# Patient Record
Sex: Female | Born: 1965 | Race: White | Hispanic: No | Marital: Married
Health system: Southern US, Community
[De-identification: ages and names within clinical notes are randomized; demographics above are authoritative.]

---

## 2013-07-01 ENCOUNTER — Other Ambulatory Visit: Payer: Self-pay | Admitting: Obstetrics and Gynecology

## 2013-07-01 DIAGNOSIS — Z1231 Encounter for screening mammogram for malignant neoplasm of breast: Secondary | ICD-10-CM

## 2013-07-28 ENCOUNTER — Ambulatory Visit
Admission: RE | Admit: 2013-07-28 | Discharge: 2013-07-28 | Disposition: A | Discharge status: Home / Self Care | Payer: Self-pay | Source: Ambulatory Visit | Attending: Obstetrics and Gynecology | Admitting: Obstetrics and Gynecology

## 2013-07-28 ENCOUNTER — Encounter (INDEPENDENT_AMBULATORY_CARE_PROVIDER_SITE_OTHER): Payer: Self-pay

## 2013-07-28 DIAGNOSIS — Z1231 Encounter for screening mammogram for malignant neoplasm of breast: Secondary | ICD-10-CM

## 2013-08-06 ENCOUNTER — Other Ambulatory Visit: Payer: Self-pay | Admitting: Obstetrics and Gynecology

## 2013-08-06 DIAGNOSIS — R928 Other abnormal and inconclusive findings on diagnostic imaging of breast: Secondary | ICD-10-CM

## 2013-08-18 ENCOUNTER — Ambulatory Visit
Admission: RE | Admit: 2013-08-18 | Discharge: 2013-08-18 | Disposition: A | Discharge status: Home / Self Care | Payer: BC Managed Care – PPO | Source: Ambulatory Visit | Attending: Obstetrics and Gynecology | Admitting: Obstetrics and Gynecology

## 2013-08-18 DIAGNOSIS — R928 Other abnormal and inconclusive findings on diagnostic imaging of breast: Secondary | ICD-10-CM

## 2014-05-10 ENCOUNTER — Other Ambulatory Visit: Payer: Self-pay | Admitting: Obstetrics and Gynecology

## 2014-05-10 DIAGNOSIS — Z1231 Encounter for screening mammogram for malignant neoplasm of breast: Secondary | ICD-10-CM

## 2014-08-05 ENCOUNTER — Ambulatory Visit: Payer: Self-pay

## 2014-08-26 ENCOUNTER — Ambulatory Visit
Admission: RE | Admit: 2014-08-26 | Discharge: 2014-08-26 | Disposition: A | Discharge status: Home / Self Care | Payer: BLUE CROSS/BLUE SHIELD | Source: Ambulatory Visit | Attending: Obstetrics and Gynecology | Admitting: Obstetrics and Gynecology

## 2014-08-26 DIAGNOSIS — Z1231 Encounter for screening mammogram for malignant neoplasm of breast: Secondary | ICD-10-CM

## 2015-03-04 ENCOUNTER — Encounter (HOSPITAL_BASED_OUTPATIENT_CLINIC_OR_DEPARTMENT_OTHER): Payer: Self-pay

## 2015-03-04 ENCOUNTER — Ambulatory Visit (HOSPITAL_BASED_OUTPATIENT_CLINIC_OR_DEPARTMENT_OTHER): Admit: 2015-03-04 | Payer: BLUE CROSS/BLUE SHIELD | Admitting: Obstetrics & Gynecology

## 2015-03-04 SURGERY — DILATATION & CURETTAGE/HYSTEROSCOPY WITH MYOSURE
Anesthesia: General

## 2016-05-03 ENCOUNTER — Other Ambulatory Visit: Payer: Self-pay | Admitting: Obstetrics & Gynecology

## 2016-05-03 DIAGNOSIS — R1905 Periumbilic swelling, mass or lump: Secondary | ICD-10-CM

## 2016-05-03 DIAGNOSIS — R198 Other specified symptoms and signs involving the digestive system and abdomen: Secondary | ICD-10-CM

## 2016-05-08 ENCOUNTER — Ambulatory Visit
Admission: RE | Admit: 2016-05-08 | Discharge: 2016-05-08 | Disposition: A | Discharge status: Home / Self Care | Payer: BLUE CROSS/BLUE SHIELD | Source: Ambulatory Visit | Attending: Obstetrics & Gynecology | Admitting: Obstetrics & Gynecology

## 2016-05-08 DIAGNOSIS — R1905 Periumbilic swelling, mass or lump: Secondary | ICD-10-CM

## 2016-05-08 DIAGNOSIS — R198 Other specified symptoms and signs involving the digestive system and abdomen: Secondary | ICD-10-CM

## 2016-05-08 MED ORDER — IOPAMIDOL (ISOVUE-300) INJECTION 61%
100.0000 mL | Freq: Once | INTRAVENOUS | Status: AC | PRN
  Administered 2016-05-08: 100 mL via INTRAVENOUS

## 2019-06-11 ENCOUNTER — Other Ambulatory Visit: Payer: Self-pay | Admitting: Obstetrics & Gynecology

## 2019-06-11 DIAGNOSIS — R928 Other abnormal and inconclusive findings on diagnostic imaging of breast: Secondary | ICD-10-CM

## 2019-06-25 ENCOUNTER — Ambulatory Visit
Admission: RE | Admit: 2019-06-25 | Discharge: 2019-06-25 | Disposition: A | Discharge status: Home / Self Care | Payer: BLUE CROSS/BLUE SHIELD | Source: Ambulatory Visit | Attending: Obstetrics & Gynecology | Admitting: Obstetrics & Gynecology

## 2019-06-25 ENCOUNTER — Ambulatory Visit
Admission: RE | Admit: 2019-06-25 | Discharge: 2019-06-25 | Disposition: A | Discharge status: Home / Self Care | Payer: Commercial Managed Care - PPO | Source: Ambulatory Visit | Attending: Obstetrics & Gynecology | Admitting: Obstetrics & Gynecology

## 2019-06-25 ENCOUNTER — Other Ambulatory Visit: Payer: Self-pay

## 2019-06-25 DIAGNOSIS — R928 Other abnormal and inconclusive findings on diagnostic imaging of breast: Secondary | ICD-10-CM

## 2021-04-15 IMAGING — MG MM DIGITAL DIAGNOSTIC UNILAT*R* W/ TOMO W/ CAD
8 series · 9 of 24 positions shown · non-contrast
Comparison: Previous exam(s).

CLINICAL DATA: Patient recalled from screening for possible right
breast mass.

EXAM:
DIGITAL DIAGNOSTIC RIGHT MAMMOGRAM WITH CAD AND TOMO
ULTRASOUND RIGHT BREAST

[R ML synth-2D]
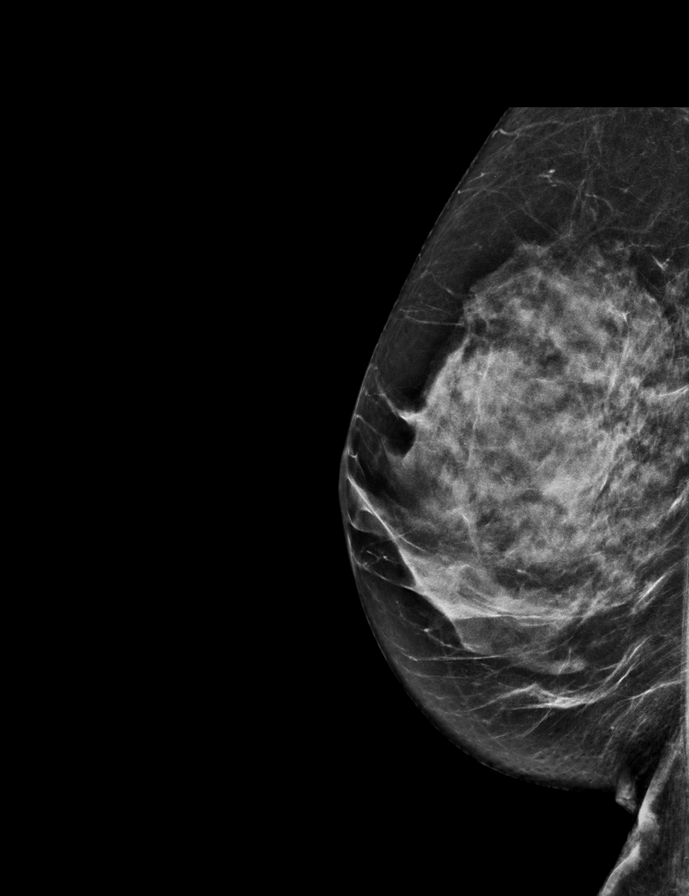

[R CC synth-2D]
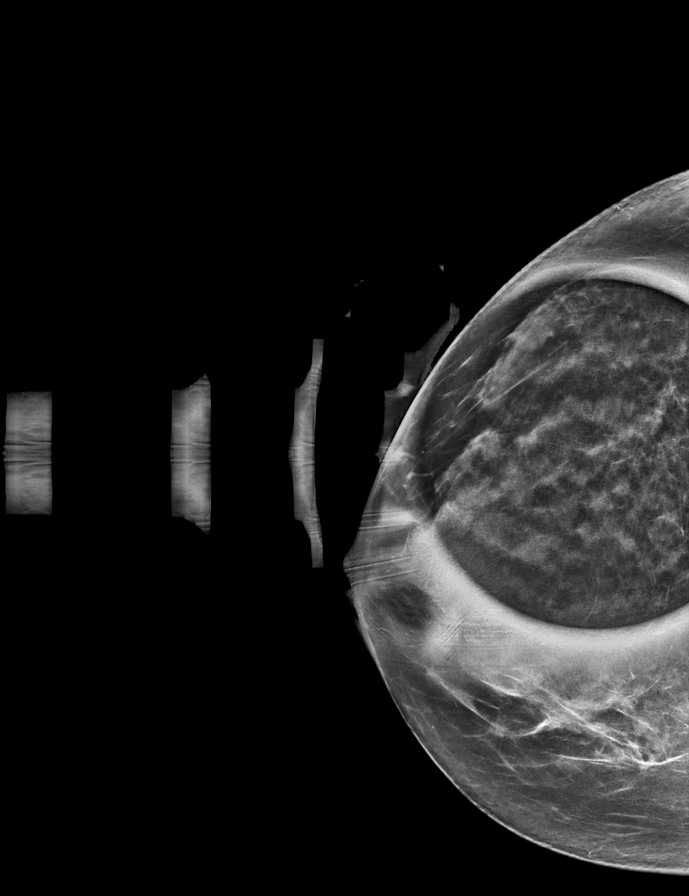

[R MLO synth-2D (1 of 2)]
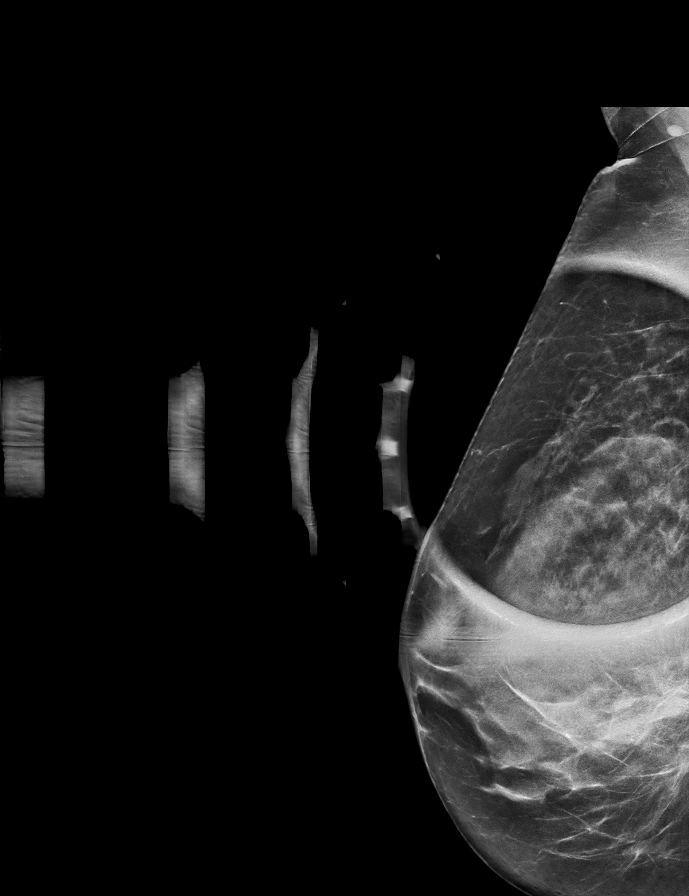

[R MLO synth-2D (2 of 2)]
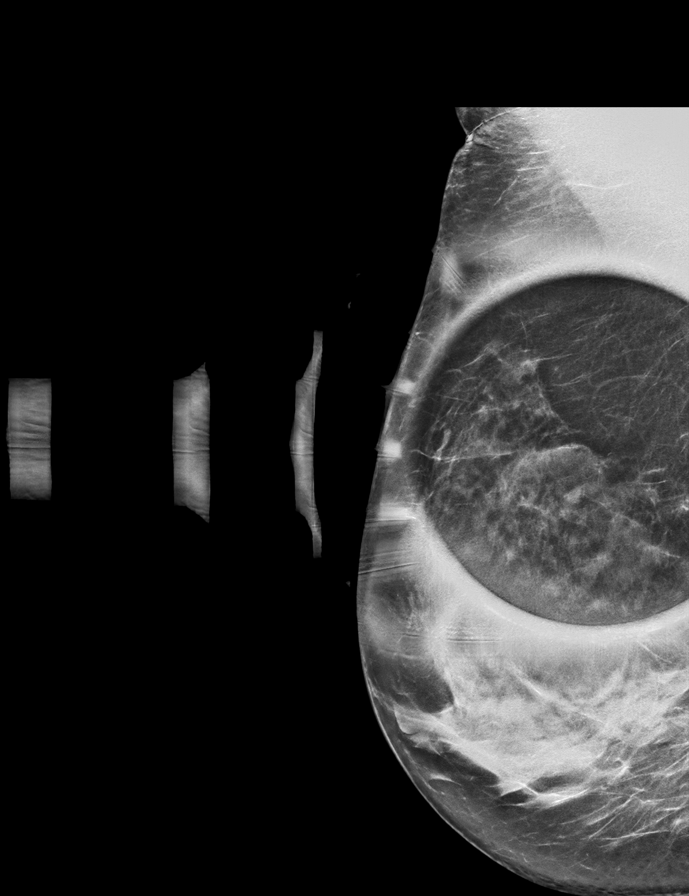

[R MLO tomo · 2 of 62 frames shown (1 of 2)]
[frame 21/62]
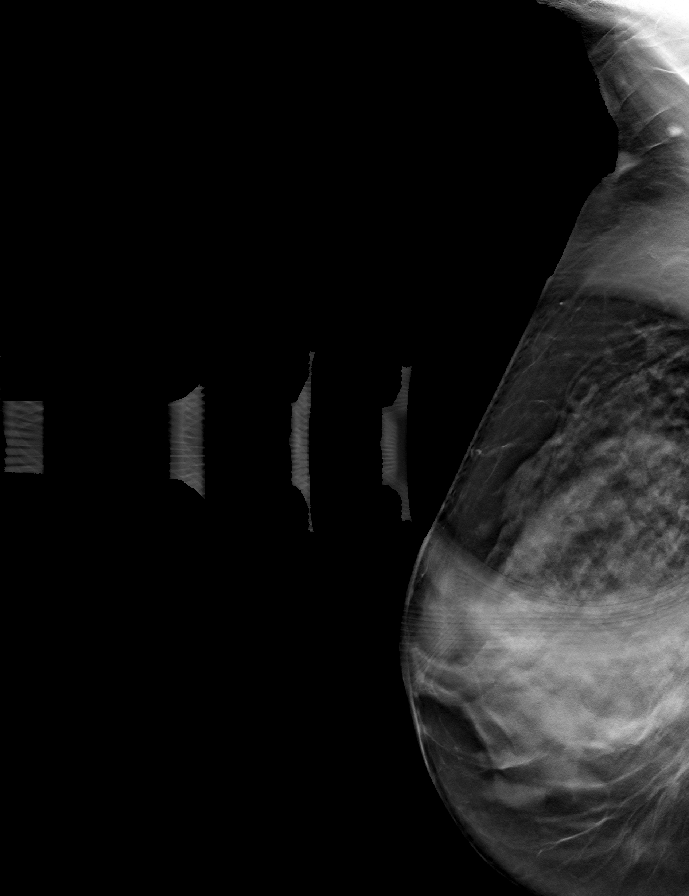
[frame 31/62]
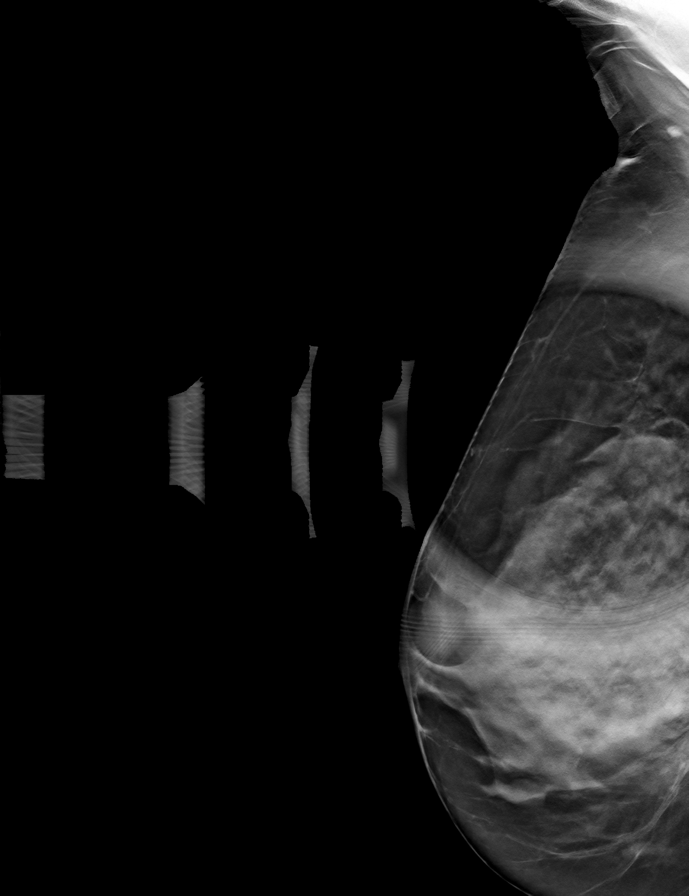

[R CC tomo · tomo slice 29/58.0]
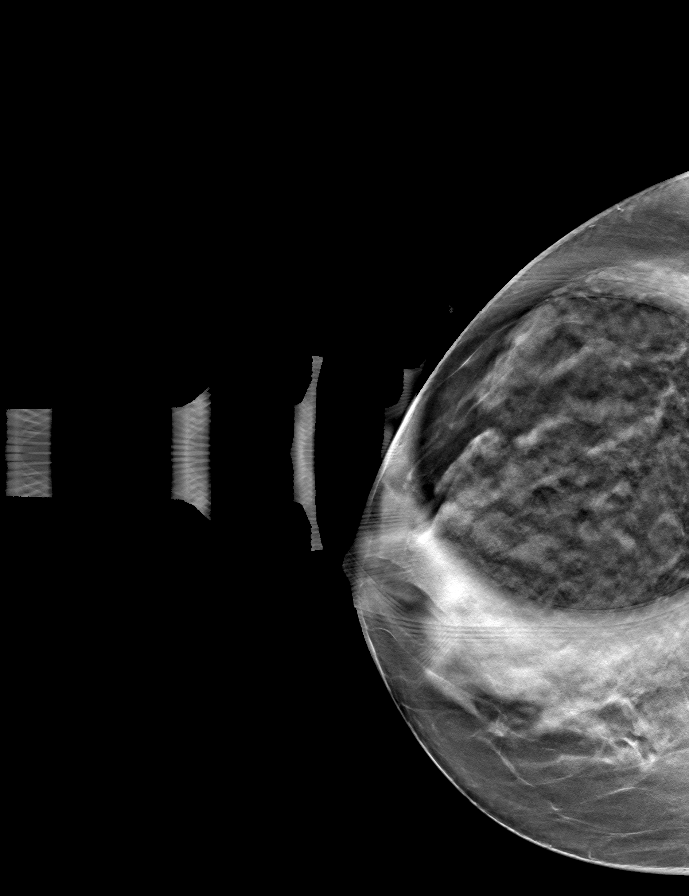

[R ML tomo · tomo slice 31/62.0]
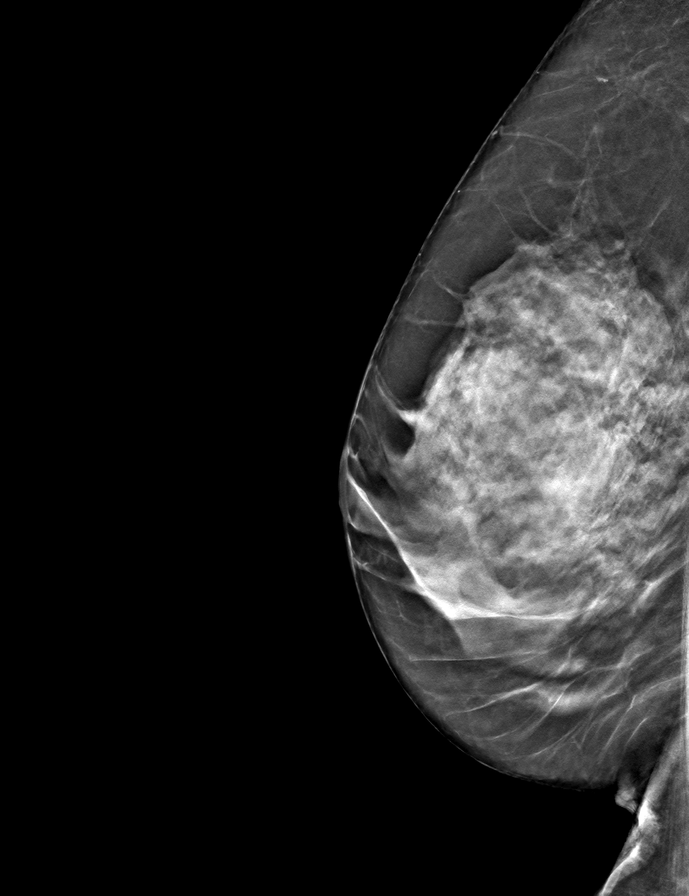

[R MLO tomo (2 of 2) · tomo slice 29/57.0]
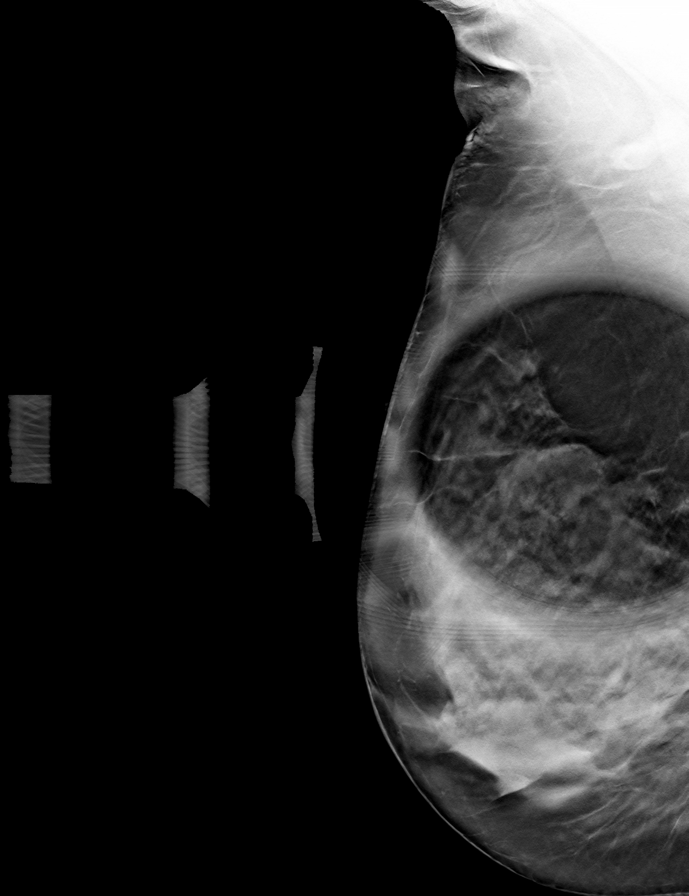

[9 of 24 positions shown; findings below may reference images not displayed]

ACR Breast Density Category c: The breast tissue is heterogeneously
dense, which may obscure small masses.
FINDINGS: Question mass within the right breast appeared to resolve with
additional imaging suggestive of dense fibroglandular tissue.

Mammographic images were processed with CAD.

Targeted ultrasound is performed, showing normal dense tissue
without suspicious mass within the upper-outer right breast.
IMPRESSION: No mammographic evidence for malignancy.

RECOMMENDATION:
Screening mammogram in one year.(Code:[id number]-[id number])

I have discussed the findings and recommendations with the patient.
If applicable, a reminder letter will be sent to the patient
regarding the next appointment.

BI-RADS CATEGORY  1: Negative.
